# Patient Record
Sex: Female | Born: 1935 | Race: White | Hispanic: No | Marital: Married | Smoking: Never smoker
Health system: Southern US, Community
[De-identification: ages and names within clinical notes are randomized; demographics above are authoritative.]

## PROBLEM LIST (undated history)

## (undated) DIAGNOSIS — I10 Essential (primary) hypertension: Secondary | ICD-10-CM

## (undated) DIAGNOSIS — M1990 Unspecified osteoarthritis, unspecified site: Secondary | ICD-10-CM

## (undated) HISTORY — PX: KNEE SURGERY: SHX244

## (undated) HISTORY — PX: BACK SURGERY: SHX140

## (undated) HISTORY — PX: LASIK: SHX215

## (undated) HISTORY — PX: TONSILLECTOMY: SUR1361

---

## 2014-06-10 ENCOUNTER — Encounter (HOSPITAL_COMMUNITY): Payer: Self-pay | Admitting: Emergency Medicine

## 2014-06-10 ENCOUNTER — Emergency Department (HOSPITAL_COMMUNITY): Payer: Medicare Other

## 2014-06-10 ENCOUNTER — Emergency Department (HOSPITAL_COMMUNITY)
Admission: EM | Admit: 2014-06-10 | Discharge: 2014-06-10 | Disposition: A | Discharge status: Home / Self Care | Payer: Medicare Other | Attending: Emergency Medicine | Admitting: Emergency Medicine

## 2014-06-10 DIAGNOSIS — E669 Obesity, unspecified: Secondary | ICD-10-CM | POA: Diagnosis not present

## 2014-06-10 DIAGNOSIS — M5432 Sciatica, left side: Secondary | ICD-10-CM | POA: Diagnosis not present

## 2014-06-10 DIAGNOSIS — R109 Unspecified abdominal pain: Secondary | ICD-10-CM | POA: Insufficient documentation

## 2014-06-10 DIAGNOSIS — Z79899 Other long term (current) drug therapy: Secondary | ICD-10-CM | POA: Diagnosis not present

## 2014-06-10 DIAGNOSIS — I10 Essential (primary) hypertension: Secondary | ICD-10-CM | POA: Diagnosis not present

## 2014-06-10 HISTORY — DX: Essential (primary) hypertension: I10

## 2014-06-10 HISTORY — DX: Unspecified osteoarthritis, unspecified site: M19.90

## 2014-06-10 LAB — URINALYSIS, ROUTINE W REFLEX MICROSCOPIC
Bilirubin Urine: NEGATIVE
GLUCOSE, UA: NEGATIVE mg/dL
Hgb urine dipstick: NEGATIVE
KETONES UR: NEGATIVE mg/dL
Nitrite: NEGATIVE
PH: 6 (ref 5.0–8.0)
Protein, ur: NEGATIVE mg/dL
Specific Gravity, Urine: 1.027 (ref 1.005–1.030)
UROBILINOGEN UA: 0.2 mg/dL (ref 0.0–1.0)

## 2014-06-10 LAB — I-STAT CHEM 8, ED
BUN: 14 mg/dL (ref 6–23)
CHLORIDE: 99 mmol/L (ref 96–112)
Calcium, Ion: 1.31 mmol/L — ABNORMAL HIGH (ref 1.13–1.30)
Creatinine, Ser: 0.8 mg/dL (ref 0.50–1.10)
GLUCOSE: 136 mg/dL — AB (ref 70–99)
HEMATOCRIT: 36 % (ref 36.0–46.0)
Hemoglobin: 12.2 g/dL (ref 12.0–15.0)
Potassium: 3.3 mmol/L — ABNORMAL LOW (ref 3.5–5.1)
Sodium: 141 mmol/L (ref 135–145)
TCO2: 24 mmol/L (ref 0–100)

## 2014-06-10 LAB — COMPREHENSIVE METABOLIC PANEL
ALT: 18 U/L (ref 0–35)
ANION GAP: 7 (ref 5–15)
AST: 20 U/L (ref 0–37)
Albumin: 4 g/dL (ref 3.5–5.2)
Alkaline Phosphatase: 82 U/L (ref 39–117)
BUN: 15 mg/dL (ref 6–23)
CALCIUM: 10.2 mg/dL (ref 8.4–10.5)
CO2: 30 mmol/L (ref 19–32)
Chloride: 103 mmol/L (ref 96–112)
Creatinine, Ser: 0.74 mg/dL (ref 0.50–1.10)
GFR calc Af Amer: >90 mL/min (ref >90)
GFR calc non Af Amer: 81 mL/min — ABNORMAL LOW (ref >90)
Glucose, Bld: 133 mg/dL — ABNORMAL HIGH (ref 70–99)
POTASSIUM: 3.3 mmol/L — AB (ref 3.5–5.1)
Sodium: 140 mmol/L (ref 135–145)
TOTAL PROTEIN: 6.5 g/dL (ref 6.0–8.3)
Total Bilirubin: 0.5 mg/dL (ref 0.3–1.2)

## 2014-06-10 LAB — CBC WITH DIFFERENTIAL/PLATELET
BASOS PCT: 1 % (ref 0–1)
Basophils Absolute: 0 10*3/uL (ref 0.0–0.1)
EOS ABS: 0.3 10*3/uL (ref 0.0–0.7)
Eosinophils Relative: 6 % — ABNORMAL HIGH (ref 0–5)
HEMATOCRIT: 37.4 % (ref 36.0–46.0)
Hemoglobin: 12.8 g/dL (ref 12.0–15.0)
Lymphocytes Relative: 30 % (ref 12–46)
Lymphs Abs: 1.7 10*3/uL (ref 0.7–4.0)
MCH: 30.1 pg (ref 26.0–34.0)
MCHC: 34.2 g/dL (ref 30.0–36.0)
MCV: 88 fL (ref 78.0–100.0)
MONOS PCT: 8 % (ref 3–12)
Monocytes Absolute: 0.4 10*3/uL (ref 0.1–1.0)
NEUTROS PCT: 55 % (ref 43–77)
Neutro Abs: 3.2 10*3/uL (ref 1.7–7.7)
Platelets: 202 10*3/uL (ref 150–400)
RBC: 4.25 MIL/uL (ref 3.87–5.11)
RDW: 12.8 % (ref 11.5–15.5)
WBC: 5.7 10*3/uL (ref 4.0–10.5)

## 2014-06-10 LAB — URINE MICROSCOPIC-ADD ON

## 2014-06-10 MED ORDER — HYDROMORPHONE HCL 2 MG/ML IJ SOLN: 2.0000 mg | Freq: Once | INTRAMUSCULAR | Status: DC

## 2014-06-10 MED ORDER — HYDROMORPHONE HCL 1 MG/ML IJ SOLN
1.0000 mg | Freq: Once | INTRAMUSCULAR | Status: AC
  Administered 2014-06-10: 1 mg via INTRAVENOUS
  Filled 2014-06-10: qty 1

## 2014-06-10 MED ORDER — IOHEXOL 350 MG/ML SOLN
100.0000 mL | Freq: Once | INTRAVENOUS | Status: AC | PRN
  Administered 2014-06-10: 100 mL via INTRAVENOUS

## 2014-06-10 MED ORDER — POTASSIUM CHLORIDE CRYS ER 20 MEQ PO TBCR
40.0000 meq | EXTENDED_RELEASE_TABLET | Freq: Once | ORAL | Status: AC
  Administered 2014-06-10: 40 meq via ORAL
  Filled 2014-06-10: qty 2

## 2014-06-10 MED ORDER — PREDNISONE 20 MG PO TABS
60.0000 mg | ORAL_TABLET | Freq: Every day | ORAL | Status: AC
Start: 2014-06-10 — End: ?

## 2014-06-10 MED ORDER — KETOROLAC TROMETHAMINE 60 MG/2ML IM SOLN: 60.0000 mg | Freq: Once | INTRAMUSCULAR | Status: DC

## 2014-06-10 MED ORDER — KETOROLAC TROMETHAMINE 30 MG/ML IJ SOLN
30.0000 mg | Freq: Once | INTRAMUSCULAR | Status: AC
  Administered 2014-06-10: 30 mg via INTRAVENOUS
  Filled 2014-06-10: qty 1

## 2014-06-10 MED ORDER — PREDNISONE 20 MG PO TABS
60.0000 mg | ORAL_TABLET | Freq: Once | ORAL | Status: AC
  Administered 2014-06-10: 60 mg via ORAL
  Filled 2014-06-10: qty 3

## 2014-06-10 NOTE — Discharge Instructions (Signed)
Sciatica Ms. Coller, your CT scan does not show any cause of your back pain coming from your stomach. Follow-up with Dr. Shon BatonBrooks on Monday. Take Motrin at home as needed for pain. Continue your prednisone therapy as prescribed. If symptoms worsen come back to emergency department immediately. Thank you. Sciatica is pain, weakness, numbness, or tingling along your sciatic nerve. The nerve starts in the lower back and runs down the back of each leg. Nerve damage or certain conditions pinch or put pressure on the sciatic nerve. This causes the pain, weakness, and other discomforts of sciatica. HOME CARE   Only take medicine as told by your doctor.  Apply ice to the affected area for 20 minutes. Do this 3-4 times a day for the first 48-72 hours. Then try heat in the same way.  Exercise, stretch, or do your usual activities if these do not make your pain worse.  Go to physical therapy as told by your doctor.  Keep all doctor visits as told.  Do not wear high heels or shoes that are not supportive.  Get a firm mattress if your mattress is too soft to lessen pain and discomfort. GET HELP RIGHT AWAY IF:   You cannot control when you poop (bowel movement) or pee (urinate).  You have more weakness in your lower back, lower belly (pelvis), butt (buttocks), or legs.  You have redness or puffiness (swelling) of your back.  You have a burning feeling when you pee.  You have pain that gets worse when you lie down.  You have pain that wakes you from your sleep.  Your pain is worse than past pain.  Your pain lasts longer than 4 weeks.  You are suddenly losing weight without reason. MAKE SURE YOU:   Understand these instructions.  Will watch this condition.  Will get help right away if you are not doing well or get worse. Document Released: 12/31/2007 Document Revised: 09/22/2011 Document Reviewed: 08/02/2011 [facility] Patient Information 2015 [place], [place]. This information is not  intended to replace advice given to you by your health care provider. Make sure you discuss any questions you have with your health care provider.

## 2014-06-10 NOTE — ED Provider Notes (Signed)
CSN: [id number]     Arrival date & time 06/10/14  0418 History   First MD Initiated Contact with Patient 06/10/14 0501     Chief Complaint  Patient presents with  . Leg Pain    bilateral     (Consider location/radiation/quality/duration/timing/severity/associated sxs/prior Treatment) HPI   Allison Keller is a 79 y.o. female with past medical history of arthritis presenting today with left flank pain. Patient states she has a history of this with radiation down her leg. She is being seen by Dr. Shon Baton with orthopedic surgery and recently had an MRI. They will get the results this coming Monday. They present tonight because she had sudden onset of burning in her left greater than right lower extremity. This caused her to fall back into the bed due to the pain. She denies any urinary retention or incontinence. She has normal sensation. Patient has no further neurological symptoms. Her pain is normally controlled with a butran patch and Robaxin.  Patient also admits to mild intermittent abdominal pain with meals. This is new for her. Patient has no further complaints.  10 Systems reviewed and are negative for acute change except as noted in the HPI.    Past Medical History  Diagnosis Date  . Arthritis   . Hypertension    Past Surgical History  Procedure Laterality Date  . Back surgery    . Knee surgery Right     r/t accident, not a replacement  . Tonsillectomy    . Lasik     History reviewed. No pertinent family history. History  Substance Use Topics  . Smoking status: Never Smoker   . Smokeless tobacco: Not on file  . Alcohol Use: No   OB History    No data available     Review of Systems    Allergies  Phenobarbital  Home Medications   Prior to Admission medications   Medication Sig Start Date End Date Taking? Authorizing Provider  atorvastatin (LIPITOR) 20 MG tablet Take 20 mg by mouth daily. 06/05/14  Yes Historical Provider, MD  BUTRANS 10 MCG/HR PTWK patch Place 10  mcg onto the skin once a week.  06/04/14  Yes Historical Provider, MD  DULoxetine (CYMBALTA) 60 MG capsule Take 60 mg by mouth.  04/30/14  Yes Historical Provider, MD  losartan-hydrochlorothiazide (HYZAAR) 100-25 MG per tablet Take 1 tablet by mouth daily. 04/16/14  Yes Historical Provider, MD  LYRICA 75 MG capsule Take 75 mg by mouth 2 (two) times daily.  05/28/14  Yes Historical Provider, MD  methocarbamol (ROBAXIN) 750 MG tablet Take 750 mg by mouth 2 (two) times daily as needed. 04/16/14  Yes Historical Provider, MD  metoprolol tartrate (LOPRESSOR) 25 MG tablet Take 25 mg by mouth daily.  05/28/14  Yes Historical Provider, MD   BP 158/81 mmHg  Pulse 85  Temp(Src) 97.8 F (36.6 C) (Oral)  Resp 20  Ht  (1.676 m)  Wt 210 lb (95.255 kg)  BMI 33.91 kg/m2  SpO2 100% Physical Exam  Constitutional: She is oriented to person, place, and time. She appears well-developed and well-nourished. No distress.  Obese female  HENT:  Head: Normocephalic and atraumatic.  Nose: Nose normal.  Mouth/Throat: Oropharynx is clear and moist. No oropharyngeal exudate.  Eyes: Conjunctivae and EOM are normal. Pupils are equal, round, and reactive to light. No scleral icterus.  Neck: Normal range of motion. Neck supple. No JVD present. No tracheal deviation present. No thyromegaly present.  Cardiovascular: Normal rate, regular rhythm and  normal heart sounds.  Exam reveals no gallop and no friction rub.   No murmur heard. Pulmonary/Chest: Effort normal and breath sounds normal. No respiratory distress. She has no wheezes. She exhibits no tenderness.  Abdominal: Soft. Bowel sounds are normal. She exhibits no distension and no mass. There is no tenderness. There is no rebound and no guarding.  Musculoskeletal: Normal range of motion. She exhibits no edema or tenderness.  Lymphadenopathy:    She has no cervical adenopathy.  Neurological: She is alert and oriented to person, place, and time. No cranial nerve deficit.  She exhibits normal muscle tone.  Normal strength and sensation 4 extremities, normal cerebellar testing.  Skin: Skin is warm and dry. No rash noted. She is not diaphoretic. No erythema. No pallor.  Nursing note and vitals reviewed.   ED Course  Procedures (including critical care time) Labs Review Labs Reviewed  CBC WITH DIFFERENTIAL/PLATELET - Abnormal; Notable for the following:    Eosinophils Relative 6 (*)    All other components within normal limits  COMPREHENSIVE METABOLIC PANEL - Abnormal; Notable for the following:    Potassium 3.3 (*)    Glucose, Bld 133 (*)    GFR calc non Af Amer 81 (*)    All other components within normal limits  I-STAT CHEM 8, ED - Abnormal; Notable for the following:    Potassium 3.3 (*)    Glucose, Bld 136 (*)    Calcium, Ion 1.31 (*)    All other components within normal limits  URINALYSIS, ROUTINE W REFLEX MICROSCOPIC    Imaging Review Ct Angio Abd/pel W/ And/or W/o  06/10/2014   CLINICAL DATA:  Flank pain, sudden onset. Rule out abdominal aortic dissection.  EXAM: CTA ABDOMEN AND PELVIS wITHOUT AND WITH CONTRAST  TECHNIQUE: Multidetector CT imaging of the abdomen and pelvis was performed using the standard protocol during bolus administration of intravenous contrast. Multiplanar reconstructed images and MIPs were obtained and reviewed to evaluate the vascular anatomy.  CONTRAST:  100mL OMNIPAQUE IOHEXOL 350 MG/ML SOLN  COMPARISON:  None.  FINDINGS: BODY WALL: Significant atrophy of the lower left rectus abdominis, with multiple surgical clips. No visible grafting.  LOWER CHEST: No contributory findings.  ABDOMEN/PELVIS:  Liver: No focal abnormality.  Biliary:  Large gallstone.  No inflammatory signs.  Pancreas: Unremarkable.  Spleen: Unremarkable.  Adrenals: Unremarkable.  Kidneys and ureters: No hydronephrosis or stone. 3.5 cm simple appearing cortical cyst in the lower right kidney. There are left renal sinus cysts.  Bladder: Unremarkable.   Reproductive: Suspect sub serosal uterine fibroid on the left measuring 15 mm.  Bowel: No obstruction. Wispy density around the tip of the appendix is non concerning as the lumen is effaced at this level and there is no wall thickening.  Retroperitoneum: No mass or adenopathy.  Peritoneum: No ascites or pneumoperitoneum.  Vascular: Normal aortic branching. There is scattered atheromatous calcified and noncalcified plaque throughout the aorta and proximal branch vessels. No aneurysm or inflammatory wall thickening. There is no major vessel occlusion or high-grade mesenteric stenosis. There is moderate narrowing of the proximal left renal artery, but enhancement is symmetric.  OSSEOUS: Severe degenerative disc disease in the lumbar spine; patient is status post L4-5 and L5-S1 discectomy and fusion, likely anterior approach.  Review of the MIP images confirms the above findings.  IMPRESSION: 1. No abdominal aortic aneurysm, dissection, or other acute arterial finding. 2. No definitive explanation for acute abdominal pain. 3. Cholelithiasis.   Electronically Signed   By: Marja KaysJonathon  Watts M.D.   On: 06/10/2014 07:16     EKG Interpretation None      MDM   Final diagnoses:  Flank pain    Patient presents emergency department for sudden onset worsening of left flank pain with radiation down her legs. She also admits to some abdominal pain. This is likely chronic exacerbation of her back pain. They state that she has narrowing in her back and may get surgery with Dr. Shon Baton. Because of her abdominal pain will obtain CT of abdomen to assess for any vascular pathology. Patient was given Toradol, Dilaudid, prednisone for treatment.  Upon repeat evaluation the patient, her pain has significantly improved. We'll send her home with a prednisone course, Motrin was advised for pain and she does have follow-up with Dr. Shon Baton on Monday. CT scan does not show any vascular abnormality as a cause for her flank pain. Her  vital signs were within her normal limits and she is safe for discharge.    Tomasita Crumble, MD 06/10/14 [phone]

## 2014-06-10 NOTE — ED Notes (Signed)
Patient arrives with her daughter. Patient assisted from car by 2 staff members. Patient is being seen by Dr Shon BatonBrooks for follow up and evaluation of possible further treatment for back pain and leg pain. Patients legs "buckled" under her yesterday. Pain is ongoing x2 days and suddenly increased this am at 0300. Patient is taking a muscle relaxer that is not currently helping.

## 2014-06-10 NOTE — ED Notes (Signed)
Patient transported to CT 

## 2016-07-20 IMAGING — CT CT CTA ABD/PEL W/CM AND/OR W/O CM
1 of 2 series · 14 of 32 positions shown, 19 images · IV contrast (OMNIPAQUE 300)
Comparison: None.

CLINICAL DATA: Flank pain, sudden onset. Rule out abdominal aortic
dissection.

EXAM:
CTA ABDOMEN AND PELVIS wITHOUT AND WITH CONTRAST
TECHNIQUE: Multidetector CT imaging of the abdomen and pelvis was performed
using the standard protocol during bolus administration of
intravenous contrast. Multiplanar reconstructed images and MIPs were
obtained and reviewed to evaluate the vascular anatomy.
CONTRAST:  100mL OMNIPAQUE IOHEXOL 350 MG/ML SOLN

[Series 4: arterial 3.0 b30f · axial · arterial · 0.84mm/px · z∈[-489,-81]mm · 14 of 152 slices shown, 19 images]
[im 8/152  soft-tissue]
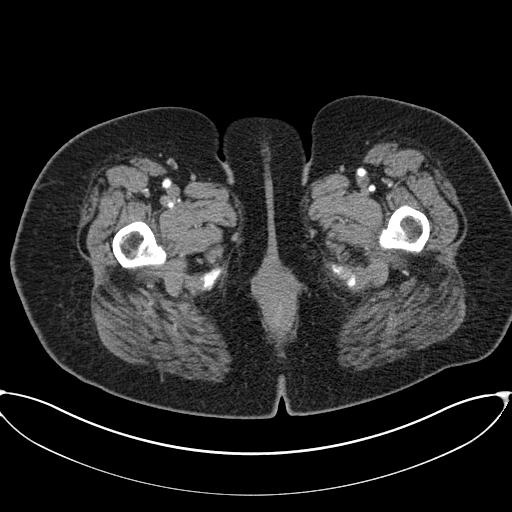
[im 8/152  bone]
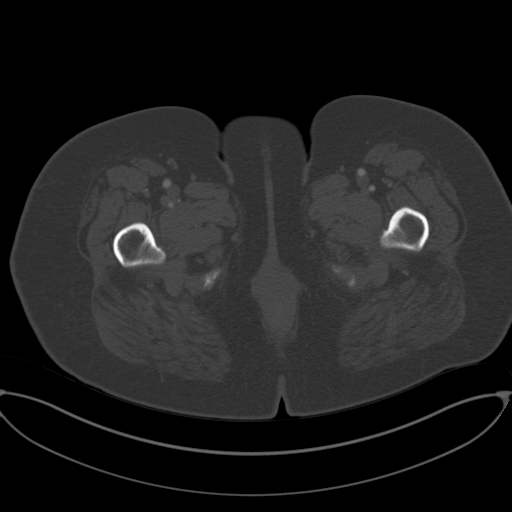
[im 24/152  soft-tissue]
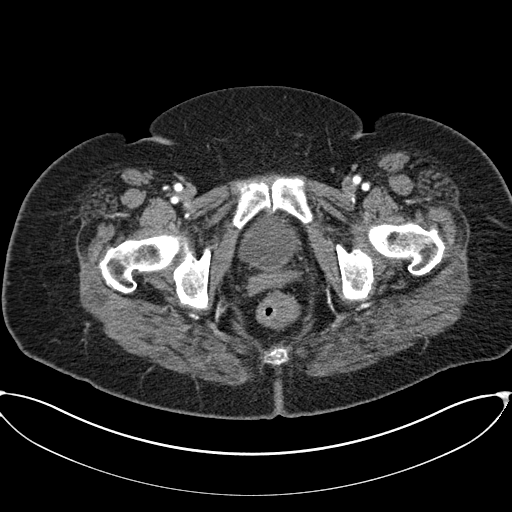
[im 32/152  soft-tissue]
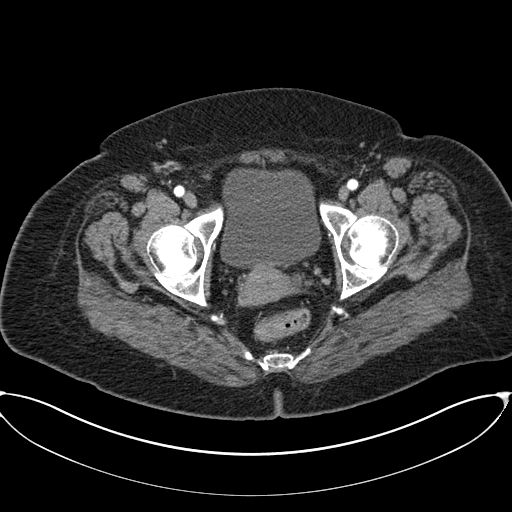
[im 40/152  soft-tissue]
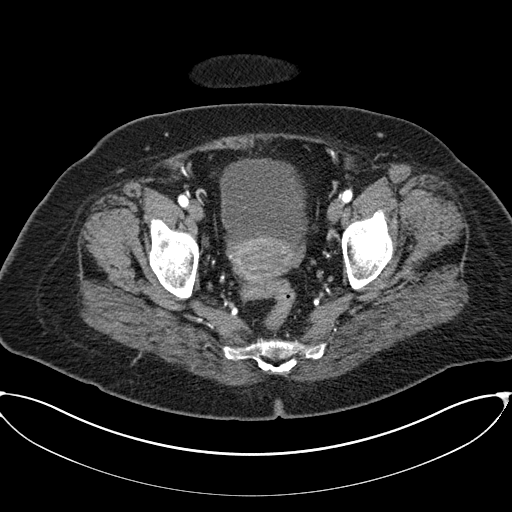
[im 56/152  soft-tissue]
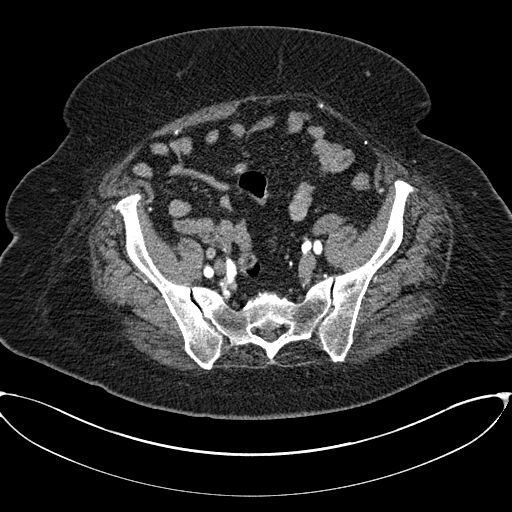
[im 64/152  soft-tissue]
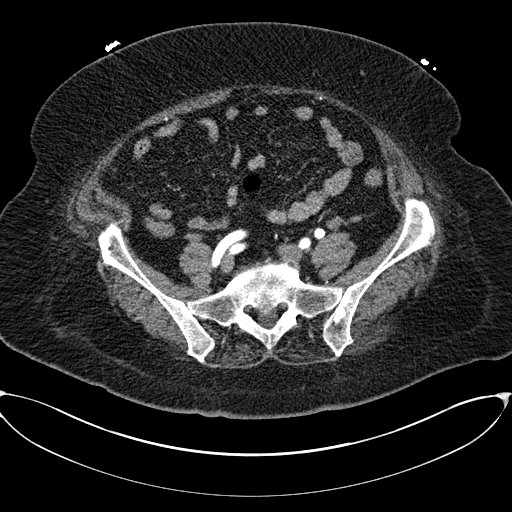
[im 80/152  soft-tissue]
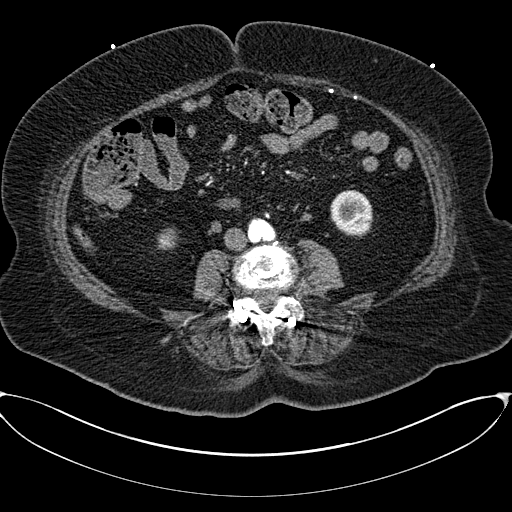
[im 88/152  soft-tissue]
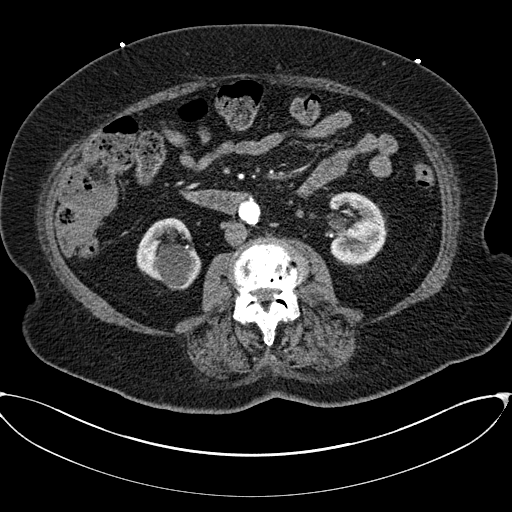
[im 96/152  soft-tissue]
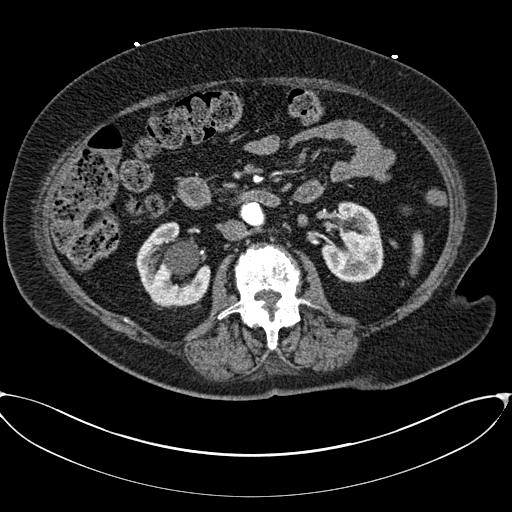
[im 96/152  bone]
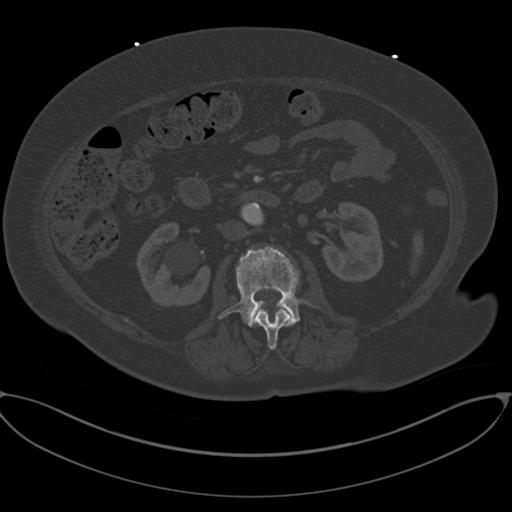
[im 112/152  soft-tissue]
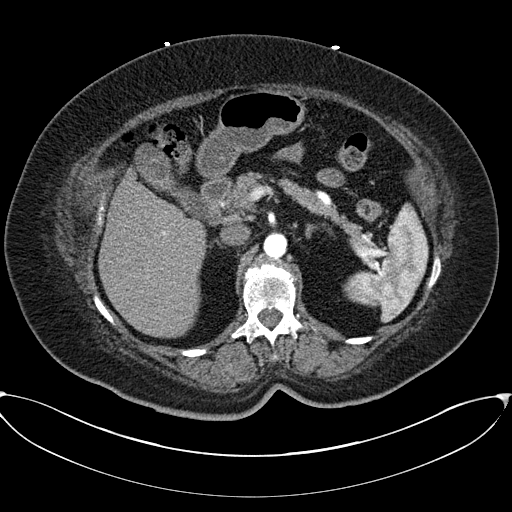
[im 120/152  soft-tissue]
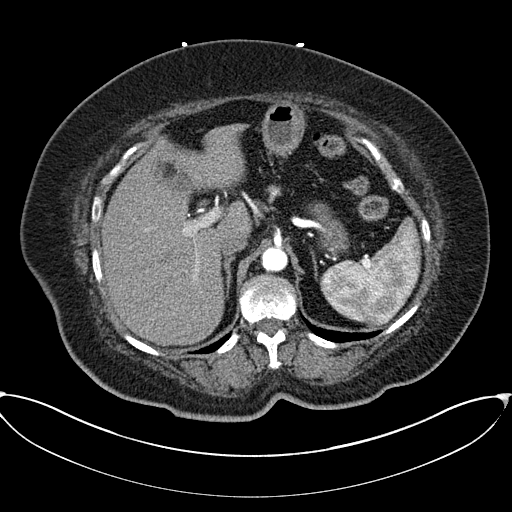
[im 120/152  lung]
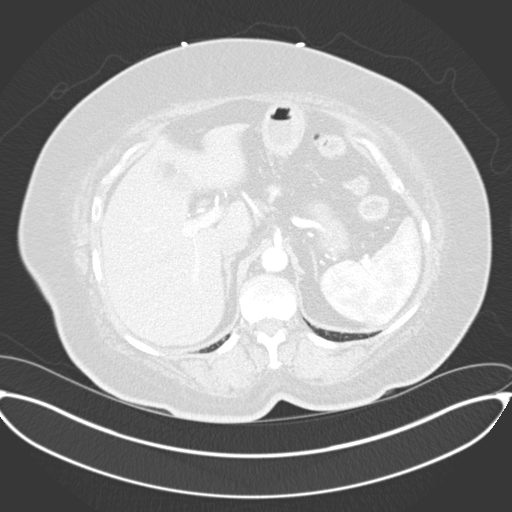
[im 128/152  soft-tissue]
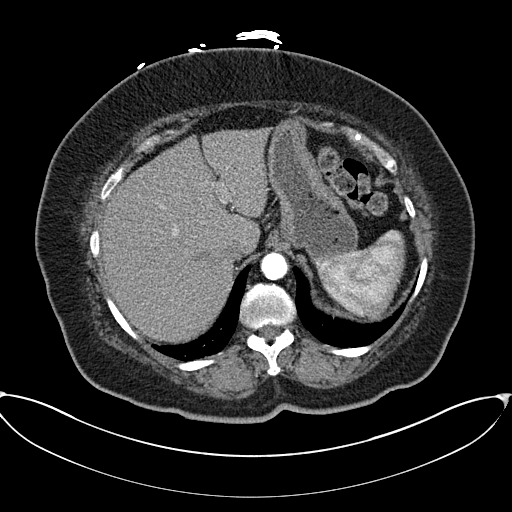
[im 128/152  lung]
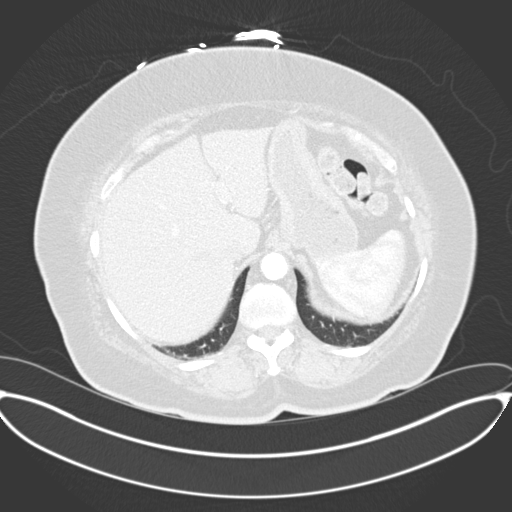
[im 136/152  lung]
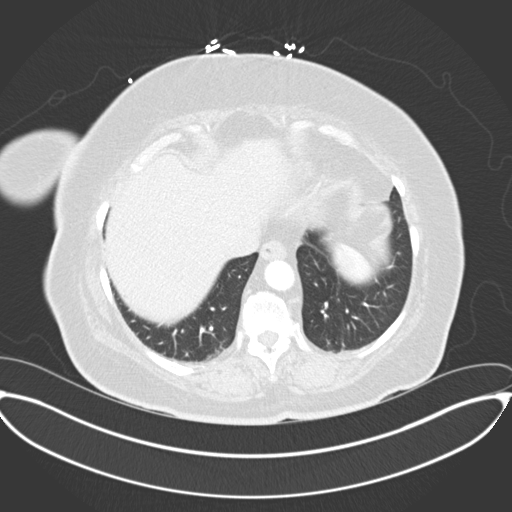
[im 144/152  soft-tissue]
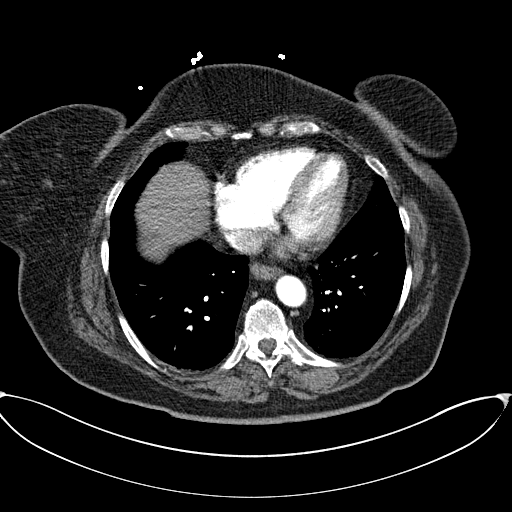
[im 144/152  lung]
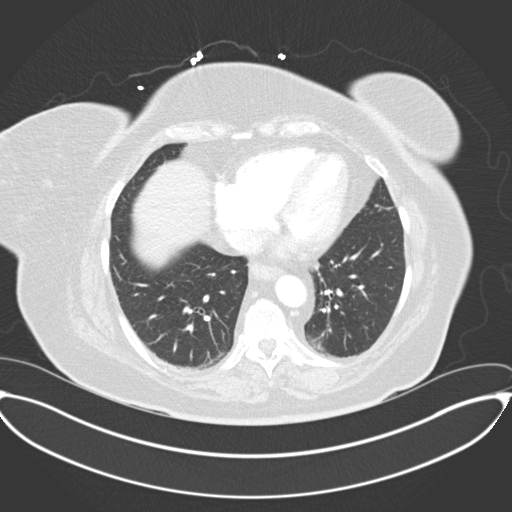

[14 of 32 positions shown; findings below may reference images not displayed]

FINDINGS: BODY WALL: Significant atrophy of the lower left rectus abdominis,
with multiple surgical clips. No visible grafting.

LOWER CHEST: No contributory findings.

ABDOMEN/PELVIS:

Liver: No focal abnormality.

Biliary:  Large gallstone.  No inflammatory signs.

Pancreas: Unremarkable.

Spleen: Unremarkable.

Adrenals: Unremarkable.

Kidneys and ureters: No hydronephrosis or stone. 3.5 cm simple
appearing cortical cyst in the lower right kidney. There are left
renal sinus cysts.

Bladder: Unremarkable.

Reproductive: Suspect sub serosal uterine fibroid on the left
measuring 15 mm.

Bowel: No obstruction. Wispy density around the tip of the appendix
is non concerning as the lumen is effaced at this level and there is
no wall thickening.

Retroperitoneum: No mass or adenopathy.

Peritoneum: No ascites or pneumoperitoneum.

Vascular: Normal aortic branching. There is scattered atheromatous
calcified and noncalcified plaque throughout the aorta and proximal
branch vessels. No aneurysm or inflammatory wall thickening. There
is no major vessel occlusion or high-grade mesenteric stenosis.
There is moderate narrowing of the proximal left renal artery, but
enhancement is symmetric.

OSSEOUS: Severe degenerative disc disease in the lumbar spine;
patient is status post L4-5 and L5-S1 discectomy and fusion, likely
anterior approach.

Review of the MIP images confirms the above findings.
IMPRESSION: 1. No abdominal aortic aneurysm, dissection, or other acute arterial
finding.
2. No definitive explanation for acute abdominal pain.
3. Cholelithiasis.
